# Patient Record
Sex: Female | Born: 1945 | Race: White | Hispanic: No | Marital: Single | Smoking: Current every day smoker
Health system: Southern US, Community
[De-identification: ages and names within clinical notes are randomized; demographics above are authoritative.]

## PROBLEM LIST (undated history)

## (undated) DIAGNOSIS — I2510 Atherosclerotic heart disease of native coronary artery without angina pectoris: Secondary | ICD-10-CM

## (undated) DIAGNOSIS — M069 Rheumatoid arthritis, unspecified: Secondary | ICD-10-CM

## (undated) DIAGNOSIS — M1990 Unspecified osteoarthritis, unspecified site: Secondary | ICD-10-CM

## (undated) HISTORY — PX: TUBAL LIGATION: SHX77

## (undated) HISTORY — PX: TONSILLECTOMY: SUR1361

## (undated) HISTORY — PX: CERVICAL CONIZATION W/BX: SHX1330

---

## 1999-06-22 ENCOUNTER — Other Ambulatory Visit: Admission: RE | Admit: 1999-06-22 | Discharge: 1999-06-22 | Payer: Self-pay | Admitting: Obstetrics and Gynecology

## 2003-09-12 ENCOUNTER — Emergency Department (HOSPITAL_COMMUNITY): Admission: EM | Admit: 2003-09-12 | Discharge: 2003-09-12 | Payer: Self-pay | Admitting: Family Medicine

## 2004-01-12 ENCOUNTER — Ambulatory Visit (HOSPITAL_COMMUNITY): Admission: RE | Admit: 2004-01-12 | Discharge: 2004-01-12 | Payer: Self-pay | Admitting: Internal Medicine

## 2004-04-27 ENCOUNTER — Ambulatory Visit: Payer: Self-pay | Admitting: *Deleted

## 2004-05-11 ENCOUNTER — Ambulatory Visit: Payer: Self-pay | Admitting: Internal Medicine

## 2004-05-22 ENCOUNTER — Ambulatory Visit: Payer: Self-pay | Admitting: Internal Medicine

## 2004-05-29 ENCOUNTER — Ambulatory Visit: Payer: Self-pay | Admitting: Internal Medicine

## 2004-08-18 ENCOUNTER — Encounter: Admission: RE | Admit: 2004-08-18 | Discharge: 2004-08-18 | Payer: Self-pay | Admitting: Rheumatology

## 2008-04-23 ENCOUNTER — Encounter: Payer: Self-pay | Admitting: Internal Medicine

## 2012-05-13 ENCOUNTER — Other Ambulatory Visit (HOSPITAL_COMMUNITY)
Admission: RE | Admit: 2012-05-13 | Discharge: 2012-05-13 | Disposition: A | Discharge status: Home / Self Care | Payer: Medicare Other | Source: Ambulatory Visit | Attending: Family Medicine | Admitting: Family Medicine

## 2012-05-13 ENCOUNTER — Other Ambulatory Visit: Payer: Self-pay | Admitting: Family Medicine

## 2012-05-13 DIAGNOSIS — Z1151 Encounter for screening for human papillomavirus (HPV): Secondary | ICD-10-CM | POA: Insufficient documentation

## 2012-05-13 DIAGNOSIS — Z124 Encounter for screening for malignant neoplasm of cervix: Secondary | ICD-10-CM | POA: Insufficient documentation

## 2012-05-13 DIAGNOSIS — R87810 Cervical high risk human papillomavirus (HPV) DNA test positive: Secondary | ICD-10-CM | POA: Insufficient documentation

## 2012-06-10 ENCOUNTER — Other Ambulatory Visit: Payer: Self-pay | Admitting: Family Medicine

## 2012-09-29 ENCOUNTER — Encounter (HOSPITAL_COMMUNITY): Payer: Self-pay

## 2012-10-09 ENCOUNTER — Encounter (HOSPITAL_COMMUNITY): Payer: Self-pay

## 2012-10-09 ENCOUNTER — Other Ambulatory Visit: Payer: Self-pay | Admitting: Obstetrics and Gynecology

## 2012-10-09 ENCOUNTER — Encounter (HOSPITAL_COMMUNITY)
Admission: RE | Admit: 2012-10-09 | Discharge: 2012-10-09 | Disposition: A | Discharge status: Home / Self Care | Payer: Medicare Other | Source: Ambulatory Visit | Attending: Obstetrics and Gynecology | Admitting: Obstetrics and Gynecology

## 2012-10-09 HISTORY — DX: Unspecified osteoarthritis, unspecified site: M19.90

## 2012-10-09 LAB — CBC
MCH: 31.9 pg (ref 26.0–34.0)
MCHC: 33.3 g/dL (ref 30.0–36.0)
Platelets: 181 10*3/uL (ref 150–400)

## 2012-10-09 NOTE — Patient Instructions (Addendum)
21 [name]  10/09/2012   Your procedure is scheduled on:  10/14/12  Enter through the Main Entrance of [facility] at 930 AM.  Pick up the phone at the desk and dial [phone].   Call this number if you have problems the morning of surgery: [phone]   Remember:   Do not eat food:After Midnight.  Do not drink clear liquids: After Midnight.  Take these medicines the morning of surgery with A SIP OF WATER: NA   Do not wear jewelry, make-up or nail polish.  Do not wear lotions, powders, or perfumes. You may wear deodorant.  Do not shave 48 hours prior to surgery.  Do not bring valuables to the hospital.  Contacts, dentures or bridgework may not be worn into surgery.  Leave suitcase in the car. After surgery it may be brought to your room.  For patients admitted to the hospital, checkout time is 11:00 AM the day of discharge.   Patients discharged the day of surgery will not be allowed to drive home.  Name and phone number of your driver: undecided  Special Instructions: Shower using CHG 2 nights before surgery and the night before surgery.  If you shower the day of surgery use CHG.  Use special wash - you have one bottle of CHG for all showers.  You should use approximately 1/3 of the bottle for each shower.   Please read over the following fact sheets that you were given: Surgical Site Infection Prevention

## 2012-10-09 NOTE — Pre-Procedure Instructions (Signed)
EKG of today (10/09/12) reviewed by Dr Cristela Blue, MD, no orders given.

## 2012-10-14 ENCOUNTER — Ambulatory Visit (HOSPITAL_COMMUNITY): Payer: Medicare Other | Admitting: Anesthesiology

## 2012-10-14 ENCOUNTER — Encounter (HOSPITAL_COMMUNITY): Payer: Self-pay | Admitting: Anesthesiology

## 2012-10-14 ENCOUNTER — Encounter (HOSPITAL_COMMUNITY)
Admission: RE | Disposition: A | Discharge status: Home / Self Care | Payer: Self-pay | Source: Ambulatory Visit | Attending: Obstetrics and Gynecology

## 2012-10-14 ENCOUNTER — Encounter (HOSPITAL_COMMUNITY): Payer: Self-pay | Admitting: *Deleted

## 2012-10-14 ENCOUNTER — Ambulatory Visit (HOSPITAL_COMMUNITY)
Admission: RE | Admit: 2012-10-14 | Discharge: 2012-10-14 | Disposition: A | Discharge status: Home / Self Care | Payer: Medicare Other | Source: Ambulatory Visit | Attending: Obstetrics and Gynecology | Admitting: Obstetrics and Gynecology

## 2012-10-14 DIAGNOSIS — N871 Moderate cervical dysplasia: Secondary | ICD-10-CM | POA: Insufficient documentation

## 2012-10-14 HISTORY — PX: CERVICAL CONIZATION W/BX: SHX1330

## 2012-10-14 SURGERY — CONE BIOPSY, CERVIX
Anesthesia: General | Site: Vagina | Wound class: Clean Contaminated

## 2012-10-14 MED ORDER — PHENYLEPHRINE 40 MCG/ML (10ML) SYRINGE FOR IV PUSH (FOR BLOOD PRESSURE SUPPORT)
PREFILLED_SYRINGE | INTRAVENOUS | Status: AC
  Filled 2012-10-14: qty 5

## 2012-10-14 MED ORDER — DEXAMETHASONE SODIUM PHOSPHATE 10 MG/ML IJ SOLN
INTRAMUSCULAR | Status: AC
  Filled 2012-10-14: qty 1

## 2012-10-14 MED ORDER — MIDAZOLAM HCL 2 MG/2ML IJ SOLN
INTRAMUSCULAR | Status: AC
  Filled 2012-10-14: qty 2

## 2012-10-14 MED ORDER — KETOROLAC TROMETHAMINE 30 MG/ML IJ SOLN
INTRAMUSCULAR | Status: DC | PRN
  Administered 2012-10-14: 15 mg via INTRAVENOUS

## 2012-10-14 MED ORDER — KETOROLAC TROMETHAMINE 30 MG/ML IJ SOLN
INTRAMUSCULAR | Status: AC
  Filled 2012-10-14: qty 1

## 2012-10-14 MED ORDER — KETOROLAC TROMETHAMINE 30 MG/ML IJ SOLN: 15.0000 mg | Freq: Once | INTRAMUSCULAR | Status: DC | PRN

## 2012-10-14 MED ORDER — MIDAZOLAM HCL 2 MG/2ML IJ SOLN: 0.5000 mg | Freq: Once | INTRAMUSCULAR | Status: DC | PRN

## 2012-10-14 MED ORDER — IODINE STRONG (LUGOLS) 5 % PO SOLN
ORAL | Status: DC | PRN
  Administered 2012-10-14: 0.1 mL

## 2012-10-14 MED ORDER — LIDOCAINE HCL (CARDIAC) 20 MG/ML IV SOLN
INTRAVENOUS | Status: DC | PRN
  Administered 2012-10-14: 50 mg via INTRAVENOUS

## 2012-10-14 MED ORDER — DEXAMETHASONE SODIUM PHOSPHATE 4 MG/ML IJ SOLN
INTRAMUSCULAR | Status: DC | PRN
  Administered 2012-10-14: 8 mg via INTRAVENOUS

## 2012-10-14 MED ORDER — LIDOCAINE-EPINEPHRINE 1 %-1:100000 IJ SOLN
INTRAMUSCULAR | Status: DC | PRN
  Administered 2012-10-14: 14 mL

## 2012-10-14 MED ORDER — FERRIC SUBSULFATE SOLN
Status: DC | PRN
  Administered 2012-10-14: 1 via TOPICAL

## 2012-10-14 MED ORDER — MIDAZOLAM HCL 5 MG/5ML IJ SOLN
INTRAMUSCULAR | Status: DC | PRN
  Administered 2012-10-14: 2 mg via INTRAVENOUS

## 2012-10-14 MED ORDER — MEPERIDINE HCL 25 MG/ML IJ SOLN: 6.2500 mg | INTRAMUSCULAR | Status: DC | PRN

## 2012-10-14 MED ORDER — FENTANYL CITRATE 0.05 MG/ML IJ SOLN
INTRAMUSCULAR | Status: AC
  Filled 2012-10-14: qty 5

## 2012-10-14 MED ORDER — PHENYLEPHRINE HCL 10 MG/ML IJ SOLN
INTRAMUSCULAR | Status: DC | PRN
  Administered 2012-10-14: 80 ug via INTRAVENOUS
  Administered 2012-10-14 (×4): 40 ug via INTRAVENOUS

## 2012-10-14 MED ORDER — PROPOFOL 10 MG/ML IV EMUL
INTRAVENOUS | Status: AC
  Filled 2012-10-14: qty 20

## 2012-10-14 MED ORDER — PROMETHAZINE HCL 25 MG/ML IJ SOLN: 6.2500 mg | INTRAMUSCULAR | Status: DC | PRN

## 2012-10-14 MED ORDER — HYDROCODONE-ACETAMINOPHEN 5-500 MG PO TABS: 1.0000 | ORAL_TABLET | ORAL | Status: AC | PRN

## 2012-10-14 MED ORDER — ONDANSETRON HCL 4 MG/2ML IJ SOLN
INTRAMUSCULAR | Status: AC
  Filled 2012-10-14: qty 2

## 2012-10-14 MED ORDER — ONDANSETRON HCL 4 MG/2ML IJ SOLN
INTRAMUSCULAR | Status: DC | PRN
  Administered 2012-10-14: 4 mg via INTRAVENOUS

## 2012-10-14 MED ORDER — LACTATED RINGERS IV SOLN
INTRAVENOUS | Status: DC
  Administered 2012-10-14 (×2): via INTRAVENOUS

## 2012-10-14 MED ORDER — PROPOFOL 10 MG/ML IV EMUL
INTRAVENOUS | Status: DC | PRN
  Administered 2012-10-14: 120 mg via INTRAVENOUS

## 2012-10-14 MED ORDER — IBUPROFEN 600 MG PO TABS: 600.0000 mg | ORAL_TABLET | Freq: Four times a day (QID) | ORAL | Status: AC | PRN

## 2012-10-14 MED ORDER — EPHEDRINE SULFATE 50 MG/ML IJ SOLN
INTRAMUSCULAR | Status: DC | PRN
  Administered 2012-10-14: 10 mg via INTRAVENOUS

## 2012-10-14 MED ORDER — FENTANYL CITRATE 0.05 MG/ML IJ SOLN
INTRAMUSCULAR | Status: DC | PRN
  Administered 2012-10-14: 25 ug via INTRAVENOUS
  Administered 2012-10-14: 50 ug via INTRAVENOUS

## 2012-10-14 MED ORDER — FENTANYL CITRATE 0.05 MG/ML IJ SOLN: 25.0000 ug | INTRAMUSCULAR | Status: DC | PRN

## 2012-10-14 MED ORDER — LIDOCAINE HCL (CARDIAC) 20 MG/ML IV SOLN
INTRAVENOUS | Status: AC
  Filled 2012-10-14: qty 5

## 2012-10-14 MED ORDER — GLYCOPYRROLATE 0.2 MG/ML IJ SOLN
INTRAMUSCULAR | Status: AC
  Filled 2012-10-14: qty 1

## 2012-10-14 SURGICAL SUPPLY — 34 items
BLADE SURG 11 STRL SS (BLADE) ×2 IMPLANT
CATH ROBINSON RED A/P 16FR (CATHETERS) ×2 IMPLANT
CLOTH BEACON ORANGE TIMEOUT ST (SAFETY) ×2 IMPLANT
CONTAINER PREFILL 10% NBF 60ML (FORM) ×2 IMPLANT
COUNTER NEEDLE 1200 MAGNETIC (NEEDLE) ×2 IMPLANT
DRAPE UNDERBUTTOCKS STRL (DRAPE) ×2 IMPLANT
DRESSING TELFA 8X3 (GAUZE/BANDAGES/DRESSINGS) ×2 IMPLANT
ELECT BALL LEEP 5MM RED (ELECTRODE) ×2 IMPLANT
ELECT REM PT RETURN 9FT ADLT (ELECTROSURGICAL) ×2
ELECTRODE REM PT RTRN 9FT ADLT (ELECTROSURGICAL) ×1 IMPLANT
GLOVE BIOGEL M 6.5 STRL (GLOVE) ×6 IMPLANT
GLOVE BIOGEL PI IND STRL 6.5 (GLOVE) ×1 IMPLANT
GLOVE BIOGEL PI INDICATOR 6.5 (GLOVE) ×1
GOWN PREVENTION PLUS XLARGE (GOWN DISPOSABLE) ×2 IMPLANT
GOWN STRL REIN XL XLG (GOWN DISPOSABLE) ×4 IMPLANT
HEMOSTAT SURGICEL 2X3 (HEMOSTASIS) ×1 IMPLANT
HEMOSTAT SURGICEL 4X8 (HEMOSTASIS) ×1 IMPLANT
NDL SPNL 22GX3.5 QUINCKE BK (NEEDLE) ×1 IMPLANT
NEEDLE SPNL 22GX3.5 QUINCKE BK (NEEDLE) ×2 IMPLANT
NS IRRIG 1000ML POUR BTL (IV SOLUTION) ×2 IMPLANT
PACK VAGINAL MINOR WOMEN LF (CUSTOM PROCEDURE TRAY) ×2 IMPLANT
PAD OB MATERNITY 4.3X12.25 (PERSONAL CARE ITEMS) ×2 IMPLANT
PENCIL BUTTON HOLSTER BLD 10FT (ELECTRODE) ×2 IMPLANT
SCOPETTES 8  STERILE (MISCELLANEOUS) ×3
SCOPETTES 8 STERILE (MISCELLANEOUS) ×2 IMPLANT
SPONGE SURGIFOAM ABS GEL 12-7 (HEMOSTASIS) ×1 IMPLANT
SUT VIC AB 0 CT1 27 (SUTURE) ×2
SUT VIC AB 0 CT1 27XBRD ANBCTR (SUTURE) ×3 IMPLANT
SUT VIC AB 0 CT2 27 (SUTURE) ×2 IMPLANT
SYR CONTROL 10ML LL (SYRINGE) ×3 IMPLANT
TOWEL OR 17X24 6PK STRL BLUE (TOWEL DISPOSABLE) ×4 IMPLANT
TUBING CONNECTING 10 (TUBING) ×2 IMPLANT
WATER STERILE IRR 1000ML POUR (IV SOLUTION) ×1 IMPLANT
YANKAUER SUCT BULB TIP NO VENT (SUCTIONS) ×2 IMPLANT

## 2012-10-14 NOTE — Anesthesia Preprocedure Evaluation (Addendum)
Anesthesia Evaluation  Patient identified by MRN, date of birth, ID band Patient awake    Reviewed: Allergy & Precautions, H&P , Patient's Chart, lab work & pertinent test results, reviewed documented beta blocker date and time   History of Anesthesia Complications Negative for: history of anesthetic complications  Airway Mallampati: II TM Distance: >3 FB Neck ROM: full    Dental no notable dental hx.    Pulmonary neg pulmonary ROS,  breath sounds clear to auscultation  Pulmonary exam normal       Cardiovascular Exercise Tolerance: Good negative cardio ROS  Rhythm:regular Rate:Normal     Neuro/Psych negative neurological ROS  negative psych ROS   GI/Hepatic negative GI ROS, Neg liver ROS,   Endo/Other  negative endocrine ROS  Renal/GU negative Renal ROS     Musculoskeletal   Abdominal   Peds  Hematology negative hematology ROS (+)   Anesthesia Other Findings Arthritis   rheumatoid   Reproductive/Obstetrics negative OB ROS                          Anesthesia Physical Anesthesia Plan  ASA: II  Anesthesia Plan: General LMA   Post-op Pain Management:    Induction:   Airway Management Planned:   Additional Equipment:   Intra-op Plan:   Post-operative Plan:   Informed Consent: I have reviewed the patients History and Physical, chart, labs and discussed the procedure including the risks, benefits and alternatives for the proposed anesthesia with the patient or authorized representative who has indicated his/her understanding and acceptance.   Dental Advisory Given  Plan Discussed with: CRNA, Surgeon and Anesthesiologist  Anesthesia Plan Comments:         Anesthesia Quick Evaluation

## 2012-10-14 NOTE — Op Note (Signed)
10/14/2012  12:25 PM  PATIENT:  [name]  67 y.o. 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 Richardson Dopp, MD - Primary  PHYSICIAN ASSISTANT: None   ASSISTANTS: none   ANESTHESIA:   general  EBL:20 cc     Total I/O In: 2000 [I.V.:2000] Out: 100 [Urine:100]  BLOOD ADMINISTERED:none  DRAINS: none   LOCAL MEDICATIONS USED:  LIDOCAINE   SPECIMEN:  Source of Specimen:  cervical cone   DISPOSITION OF SPECIMEN:  PATHOLOGY  COUNTS:  YES  TOURNIQUET:  * No tourniquets in log *  DICTATION: .Dragon Dictation  PLAN OF CARE: Discharge to home after PACU  PATIENT DISPOSITION:  PACU - hemodynamically stable.   Delay start of Pharmacological VTE agent (>24 hrs) due to surgical blood loss or risk of bleeding: not applicable  Indication. 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   Procedure: Pt was taken to the operating room where she was placed under general anesthesia. She was placed in the dorsal lithotomy position, prepped and draped in the usual sterile fashion. A speculum was placed into the vaginal vault.  Suture was placed at  the 3 and 9 o'clock position and used for retraction. Lugol's was used to stain abnormal cells. The scalpel was used to excise a cone shaped portion of the cervix. Hemostasis was obtained with rollerball and Monsel.  surgicel was placed into the site of conization. All instruments were removed from the vagina. The patient was awakened from anesthesia. She was taken to the recovery room in stable condition. All instrument counts were correct x 2.

## 2012-10-14 NOTE — Anesthesia Postprocedure Evaluation (Signed)
  Anesthesia Post-op Note  Anesthesia Post Note  Patient: [name]  Procedure(s) Performed: Procedure(s) (LRB): CONIZATION CERVIX WITH BIOPSY (N/A)  Anesthesia type: General  Patient location: PACU  Post pain: Pain level controlled  Post assessment: Post-op Vital signs reviewed  Last Vitals:  Filed Vitals:   10/14/12 1300  BP: 102/57  Pulse: 86  Temp: 36.6 C  Resp: 16    Post vital signs: Reviewed  Level of consciousness: sedated  Complications: No apparent anesthesia complications

## 2012-10-14 NOTE — Transfer of Care (Signed)
Immediate Anesthesia Transfer of Care Note  Patient: [name]  Procedure(s) Performed: Procedure(s): CONIZATION CERVIX WITH BIOPSY (N/A)  Patient Location: PACU  Anesthesia Type:General  Level of Consciousness: awake, alert  and oriented  Airway & Oxygen Therapy: Patient Spontanous Breathing and Patient connected to nasal cannula oxygen  Post-op Assessment: Report given to PACU RN and Post -op Vital signs reviewed and stable  Post vital signs: Reviewed and stable  Complications: No apparent anesthesia complications

## 2012-10-14 NOTE — H&P (Signed)
Date of Initial H&P:10/09/2012  History reviewed, patient examined, no change in status, stable for surgery.

## 2012-10-15 ENCOUNTER — Encounter (HOSPITAL_COMMUNITY): Payer: Self-pay | Admitting: Obstetrics and Gynecology

## 2013-05-08 ENCOUNTER — Other Ambulatory Visit: Payer: Self-pay

## 2013-05-08 DIAGNOSIS — Z1231 Encounter for screening mammogram for malignant neoplasm of breast: Secondary | ICD-10-CM

## 2013-05-11 ENCOUNTER — Ambulatory Visit
Admission: RE | Admit: 2013-05-11 | Discharge: 2013-05-11 | Disposition: A | Discharge status: Home / Self Care | Payer: Medicare Other | Source: Ambulatory Visit

## 2013-05-11 DIAGNOSIS — Z1231 Encounter for screening mammogram for malignant neoplasm of breast: Secondary | ICD-10-CM

## 2013-05-15 ENCOUNTER — Other Ambulatory Visit: Payer: Self-pay | Admitting: Obstetrics and Gynecology

## 2013-05-15 DIAGNOSIS — R928 Other abnormal and inconclusive findings on diagnostic imaging of breast: Secondary | ICD-10-CM

## 2013-06-01 ENCOUNTER — Other Ambulatory Visit: Payer: Self-pay | Admitting: Obstetrics and Gynecology

## 2013-06-01 ENCOUNTER — Other Ambulatory Visit (HOSPITAL_COMMUNITY)
Admission: RE | Admit: 2013-06-01 | Discharge: 2013-06-01 | Disposition: A | Discharge status: Home / Self Care | Payer: Medicare Other | Source: Ambulatory Visit | Attending: Obstetrics and Gynecology | Admitting: Obstetrics and Gynecology

## 2013-06-01 DIAGNOSIS — Z124 Encounter for screening for malignant neoplasm of cervix: Secondary | ICD-10-CM | POA: Insufficient documentation

## 2013-06-02 ENCOUNTER — Ambulatory Visit
Admission: RE | Admit: 2013-06-02 | Discharge: 2013-06-02 | Disposition: A | Discharge status: Home / Self Care | Payer: Medicare Other | Source: Ambulatory Visit | Attending: Obstetrics and Gynecology | Admitting: Obstetrics and Gynecology

## 2013-06-02 DIAGNOSIS — R928 Other abnormal and inconclusive findings on diagnostic imaging of breast: Secondary | ICD-10-CM

## 2013-09-13 ENCOUNTER — Inpatient Hospital Stay (HOSPITAL_COMMUNITY)
Admission: EM | Admit: 2013-09-13 | Discharge: 2013-09-20 | DRG: 955 | Disposition: E | Payer: No Typology Code available for payment source | Attending: General Surgery | Admitting: General Surgery

## 2013-09-13 ENCOUNTER — Encounter (HOSPITAL_COMMUNITY): Payer: Self-pay | Admitting: Anesthesiology

## 2013-09-13 ENCOUNTER — Inpatient Hospital Stay (HOSPITAL_COMMUNITY): Payer: No Typology Code available for payment source | Admitting: Anesthesiology

## 2013-09-13 ENCOUNTER — Inpatient Hospital Stay (HOSPITAL_COMMUNITY): Payer: No Typology Code available for payment source

## 2013-09-13 ENCOUNTER — Encounter: Payer: Self-pay | Admitting: Cardiology

## 2013-09-13 ENCOUNTER — Emergency Department (HOSPITAL_COMMUNITY): Payer: No Typology Code available for payment source

## 2013-09-13 ENCOUNTER — Encounter (HOSPITAL_COMMUNITY): Admission: EM | Disposition: E | Payer: Medicare Other | Source: Home / Self Care

## 2013-09-13 ENCOUNTER — Inpatient Hospital Stay: Admit: 2013-09-13 | Payer: Medicare Other | Admitting: Neurosurgery

## 2013-09-13 ENCOUNTER — Encounter (HOSPITAL_COMMUNITY): Payer: No Typology Code available for payment source | Admitting: Anesthesiology

## 2013-09-13 DIAGNOSIS — I2584 Coronary atherosclerosis due to calcified coronary lesion: Secondary | ICD-10-CM | POA: Diagnosis present

## 2013-09-13 DIAGNOSIS — S065XAA Traumatic subdural hemorrhage with loss of consciousness status unknown, initial encounter: Secondary | ICD-10-CM

## 2013-09-13 DIAGNOSIS — J95821 Acute postprocedural respiratory failure: Secondary | ICD-10-CM

## 2013-09-13 DIAGNOSIS — S270XXA Traumatic pneumothorax, initial encounter: Secondary | ICD-10-CM

## 2013-09-13 DIAGNOSIS — I959 Hypotension, unspecified: Secondary | ICD-10-CM | POA: Diagnosis present

## 2013-09-13 DIAGNOSIS — S32509A Unspecified fracture of unspecified pubis, initial encounter for closed fracture: Secondary | ICD-10-CM | POA: Diagnosis present

## 2013-09-13 DIAGNOSIS — S2249XA Multiple fractures of ribs, unspecified side, initial encounter for closed fracture: Secondary | ICD-10-CM | POA: Diagnosis present

## 2013-09-13 DIAGNOSIS — S42033A Displaced fracture of lateral end of unspecified clavicle, initial encounter for closed fracture: Secondary | ICD-10-CM | POA: Diagnosis present

## 2013-09-13 DIAGNOSIS — S066XAA Traumatic subarachnoid hemorrhage with loss of consciousness status unknown, initial encounter: Secondary | ICD-10-CM

## 2013-09-13 DIAGNOSIS — I319 Disease of pericardium, unspecified: Secondary | ICD-10-CM | POA: Diagnosis present

## 2013-09-13 DIAGNOSIS — Z66 Do not resuscitate: Secondary | ICD-10-CM | POA: Diagnosis present

## 2013-09-13 DIAGNOSIS — S069XAA Unspecified intracranial injury with loss of consciousness status unknown, initial encounter: Secondary | ICD-10-CM | POA: Diagnosis present

## 2013-09-13 DIAGNOSIS — S272XXA Traumatic hemopneumothorax, initial encounter: Secondary | ICD-10-CM | POA: Diagnosis present

## 2013-09-13 DIAGNOSIS — S22009A Unspecified fracture of unspecified thoracic vertebra, initial encounter for closed fracture: Secondary | ICD-10-CM | POA: Diagnosis present

## 2013-09-13 DIAGNOSIS — I6200 Nontraumatic subdural hemorrhage, unspecified: Secondary | ICD-10-CM | POA: Diagnosis present

## 2013-09-13 DIAGNOSIS — Z9911 Dependence on respirator [ventilator] status: Secondary | ICD-10-CM

## 2013-09-13 DIAGNOSIS — M069 Rheumatoid arthritis, unspecified: Secondary | ICD-10-CM | POA: Diagnosis present

## 2013-09-13 DIAGNOSIS — F17200 Nicotine dependence, unspecified, uncomplicated: Secondary | ICD-10-CM | POA: Diagnosis present

## 2013-09-13 DIAGNOSIS — S32409A Unspecified fracture of unspecified acetabulum, initial encounter for closed fracture: Secondary | ICD-10-CM | POA: Diagnosis present

## 2013-09-13 DIAGNOSIS — I609 Nontraumatic subarachnoid hemorrhage, unspecified: Secondary | ICD-10-CM | POA: Diagnosis present

## 2013-09-13 DIAGNOSIS — I2109 ST elevation (STEMI) myocardial infarction involving other coronary artery of anterior wall: Secondary | ICD-10-CM

## 2013-09-13 DIAGNOSIS — S065X9A Traumatic subdural hemorrhage with loss of consciousness of unspecified duration, initial encounter: Secondary | ICD-10-CM

## 2013-09-13 DIAGNOSIS — S2500XA Unspecified injury of thoracic aorta, initial encounter: Secondary | ICD-10-CM

## 2013-09-13 DIAGNOSIS — J942 Hemothorax: Secondary | ICD-10-CM

## 2013-09-13 DIAGNOSIS — S42109A Fracture of unspecified part of scapula, unspecified shoulder, initial encounter for closed fracture: Secondary | ICD-10-CM | POA: Diagnosis present

## 2013-09-13 DIAGNOSIS — I252 Old myocardial infarction: Secondary | ICD-10-CM

## 2013-09-13 DIAGNOSIS — S069X9A Unspecified intracranial injury with loss of consciousness of unspecified duration, initial encounter: Secondary | ICD-10-CM | POA: Diagnosis present

## 2013-09-13 DIAGNOSIS — K769 Liver disease, unspecified: Secondary | ICD-10-CM | POA: Diagnosis present

## 2013-09-13 DIAGNOSIS — S329XXA Fracture of unspecified parts of lumbosacral spine and pelvis, initial encounter for closed fracture: Secondary | ICD-10-CM | POA: Diagnosis present

## 2013-09-13 DIAGNOSIS — J9600 Acute respiratory failure, unspecified whether with hypoxia or hypercapnia: Secondary | ICD-10-CM | POA: Diagnosis present

## 2013-09-13 DIAGNOSIS — T797XXA Traumatic subcutaneous emphysema, initial encounter: Secondary | ICD-10-CM | POA: Diagnosis present

## 2013-09-13 DIAGNOSIS — S27329A Contusion of lung, unspecified, initial encounter: Secondary | ICD-10-CM | POA: Diagnosis present

## 2013-09-13 DIAGNOSIS — S40019A Contusion of unspecified shoulder, initial encounter: Secondary | ICD-10-CM | POA: Diagnosis present

## 2013-09-13 DIAGNOSIS — S2691XA Contusion of heart, unspecified with or without hemopericardium, initial encounter: Secondary | ICD-10-CM | POA: Diagnosis present

## 2013-09-13 DIAGNOSIS — I2510 Atherosclerotic heart disease of native coronary artery without angina pectoris: Secondary | ICD-10-CM | POA: Diagnosis present

## 2013-09-13 DIAGNOSIS — J9589 Other postprocedural complications and disorders of respiratory system, not elsewhere classified: Secondary | ICD-10-CM | POA: Diagnosis present

## 2013-09-13 DIAGNOSIS — S72009A Fracture of unspecified part of neck of unspecified femur, initial encounter for closed fracture: Secondary | ICD-10-CM | POA: Diagnosis present

## 2013-09-13 DIAGNOSIS — S066X9A Traumatic subarachnoid hemorrhage with loss of consciousness of unspecified duration, initial encounter: Secondary | ICD-10-CM

## 2013-09-13 DIAGNOSIS — S2501XA Minor laceration of thoracic aorta, initial encounter: Secondary | ICD-10-CM | POA: Diagnosis present

## 2013-09-13 DIAGNOSIS — S73006A Unspecified dislocation of unspecified hip, initial encounter: Secondary | ICD-10-CM | POA: Diagnosis present

## 2013-09-13 DIAGNOSIS — Z8249 Family history of ischemic heart disease and other diseases of the circulatory system: Secondary | ICD-10-CM

## 2013-09-13 DIAGNOSIS — K7689 Other specified diseases of liver: Secondary | ICD-10-CM | POA: Diagnosis present

## 2013-09-13 HISTORY — PX: CRANIOTOMY: SHX93

## 2013-09-13 HISTORY — PX: HIP CLOSED REDUCTION: SHX983

## 2013-09-13 HISTORY — DX: Rheumatoid arthritis, unspecified: M06.9

## 2013-09-13 HISTORY — DX: Atherosclerotic heart disease of native coronary artery without angina pectoris: I25.10

## 2013-09-13 LAB — I-STAT CHEM 8, ED
BUN: 17 mg/dL (ref 6–23)
Calcium, Ion: 1.09 mmol/L — ABNORMAL LOW (ref 1.13–1.30)
Chloride: 103 mEq/L (ref 96–112)
Creatinine, Ser: 1 mg/dL (ref 0.50–1.10)
Glucose, Bld: 203 mg/dL — ABNORMAL HIGH (ref 70–99)
HCT: 31 % — ABNORMAL LOW (ref 36.0–46.0)
HEMOGLOBIN: 10.5 g/dL — AB (ref 12.0–15.0)
Potassium: 3.8 mEq/L (ref 3.7–5.3)
Sodium: 138 mEq/L (ref 137–147)
TCO2: 22 mmol/L (ref 0–100)

## 2013-09-13 LAB — CBC
HCT: 24.8 % — ABNORMAL LOW (ref 36.0–46.0)
HEMATOCRIT: 30.3 % — AB (ref 36.0–46.0)
Hemoglobin: 10.3 g/dL — ABNORMAL LOW (ref 12.0–15.0)
Hemoglobin: 8.3 g/dL — ABNORMAL LOW (ref 12.0–15.0)
MCH: 33.9 pg (ref 26.0–34.0)
MCH: 30.4 pg (ref 26.0–34.0)
MCHC: 34 g/dL (ref 30.0–36.0)
MCHC: 33.5 g/dL (ref 30.0–36.0)
MCV: 99.7 fL (ref 78.0–100.0)
MCV: 90.8 fL (ref 78.0–100.0)
PLATELETS: 135 10*3/uL — AB (ref 150–400)
PLATELETS: 72 10*3/uL — AB (ref 150–400)
RBC: 3.04 MIL/uL — ABNORMAL LOW (ref 3.87–5.11)
RBC: 2.73 MIL/uL — AB (ref 3.87–5.11)
RDW: 14.8 % (ref 11.5–15.5)
RDW: 17 % — AB (ref 11.5–15.5)
WBC: 12.2 10*3/uL — ABNORMAL HIGH (ref 4.0–10.5)
WBC: 15 10*3/uL — ABNORMAL HIGH (ref 4.0–10.5)

## 2013-09-13 LAB — URINALYSIS, ROUTINE W REFLEX MICROSCOPIC
BILIRUBIN URINE: NEGATIVE
GLUCOSE, UA: NEGATIVE mg/dL
KETONES UR: NEGATIVE mg/dL
Nitrite: NEGATIVE
PH: 6 (ref 5.0–8.0)
Protein, ur: NEGATIVE mg/dL
Specific Gravity, Urine: 1.043 — ABNORMAL HIGH (ref 1.005–1.030)
Urobilinogen, UA: 0.2 mg/dL (ref 0.0–1.0)

## 2013-09-13 LAB — COMPREHENSIVE METABOLIC PANEL
ALK PHOS: 51 U/L (ref 39–117)
ALT: 152 U/L — AB (ref 0–35)
AST: 212 U/L — ABNORMAL HIGH (ref 0–37)
Albumin: 2.3 g/dL — ABNORMAL LOW (ref 3.5–5.2)
BUN: 15 mg/dL (ref 6–23)
CHLORIDE: 104 meq/L (ref 96–112)
CO2: 19 meq/L (ref 19–32)
Calcium: 7 mg/dL — ABNORMAL LOW (ref 8.4–10.5)
Creatinine, Ser: 0.89 mg/dL (ref 0.50–1.10)
GFR calc non Af Amer: 66 mL/min — ABNORMAL LOW (ref >90)
GFR, EST AFRICAN AMERICAN: 77 mL/min — AB (ref >90)
Glucose, Bld: 206 mg/dL — ABNORMAL HIGH (ref 70–99)
POTASSIUM: 3.9 meq/L (ref 3.7–5.3)
SODIUM: 137 meq/L (ref 137–147)
TOTAL PROTEIN: 4.7 g/dL — AB (ref 6.0–8.3)

## 2013-09-13 LAB — BASIC METABOLIC PANEL
BUN: 12 mg/dL (ref 6–23)
CALCIUM: 7.6 mg/dL — AB (ref 8.4–10.5)
CO2: 21 mEq/L (ref 19–32)
Chloride: 113 mEq/L — ABNORMAL HIGH (ref 96–112)
Creatinine, Ser: 0.68 mg/dL (ref 0.50–1.10)
GFR calc Af Amer: >90 mL/min (ref >90)
GFR, EST NON AFRICAN AMERICAN: 89 mL/min — AB (ref >90)
Glucose, Bld: 152 mg/dL — ABNORMAL HIGH (ref 70–99)
Potassium: 4.2 mEq/L (ref 3.7–5.3)
SODIUM: 144 meq/L (ref 137–147)

## 2013-09-13 LAB — TYPE AND SCREEN
ABO/RH(D): O NEG
Antibody Screen: NEGATIVE

## 2013-09-13 LAB — URINE MICROSCOPIC-ADD ON

## 2013-09-13 LAB — TROPONIN I
Troponin I: <0.3 ng/mL (ref <0.30)
Troponin I: <0.3 ng/mL (ref <0.30)

## 2013-09-13 LAB — CDS SEROLOGY

## 2013-09-13 LAB — PROTIME-INR
INR: 1.25 (ref 0.00–1.49)
INR: 1.52 — ABNORMAL HIGH (ref 0.00–1.49)
Prothrombin Time: 15.4 seconds — ABNORMAL HIGH (ref 11.6–15.2)
Prothrombin Time: 17.9 seconds — ABNORMAL HIGH (ref 11.6–15.2)

## 2013-09-13 LAB — LACTIC ACID, PLASMA: LACTIC ACID, VENOUS: 2.8 mmol/L — AB (ref 0.5–2.2)

## 2013-09-13 LAB — I-STAT CG4 LACTIC ACID, ED: LACTIC ACID, VENOUS: 2.68 mmol/L — AB (ref 0.5–2.2)

## 2013-09-13 LAB — TRIGLYCERIDES: TRIGLYCERIDES: 81 mg/dL (ref <150)

## 2013-09-13 LAB — ABO/RH: ABO/RH(D): O NEG

## 2013-09-13 SURGERY — CRANIOTOMY HEMATOMA EVACUATION SUBDURAL
Anesthesia: General | Site: Hip | Laterality: Right

## 2013-09-13 MED ORDER — HEMOSTATIC AGENTS (NO CHARGE) OPTIME
TOPICAL | Status: DC | PRN
  Administered 2013-09-13: 1 via TOPICAL

## 2013-09-13 MED ORDER — GELATIN ABSORBABLE 100 EX MISC
CUTANEOUS | Status: DC | PRN
  Administered 2013-09-13 (×2): via TOPICAL

## 2013-09-13 MED ORDER — PROPOFOL 10 MG/ML IV EMUL
0.0000 ug/kg/min | INTRAVENOUS | Status: DC
  Administered 2013-09-14: 25 ug/kg/min via INTRAVENOUS
  Administered 2013-09-14: 20 ug/kg/min via INTRAVENOUS
  Filled 2013-09-13 (×2): qty 100

## 2013-09-13 MED ORDER — ACETAMINOPHEN 325 MG PO TABS: 650.0000 mg | ORAL_TABLET | ORAL | Status: DC | PRN

## 2013-09-13 MED ORDER — VECURONIUM BROMIDE 10 MG IV SOLR
INTRAVENOUS | Status: DC | PRN
  Administered 2013-09-13: 10 mg via INTRAVENOUS

## 2013-09-13 MED ORDER — CEFAZOLIN SODIUM-DEXTROSE 2-3 GM-% IV SOLR
INTRAVENOUS | Status: AC
  Filled 2013-09-13: qty 100

## 2013-09-13 MED ORDER — FENTANYL CITRATE 0.05 MG/ML IJ SOLN
INTRAMUSCULAR | Status: AC
  Filled 2013-09-13: qty 2

## 2013-09-13 MED ORDER — CHLORHEXIDINE GLUCONATE 0.12 % MT SOLN
15.0000 mL | Freq: Two times a day (BID) | OROMUCOSAL | Status: DC
  Administered 2013-09-14 – 2013-09-15 (×4): 15 mL via OROMUCOSAL
  Filled 2013-09-13 (×4): qty 15

## 2013-09-13 MED ORDER — MANNITOL 25 % IV SOLN
INTRAVENOUS | Status: DC | PRN
  Administered 2013-09-13: 25 g via INTRAVENOUS

## 2013-09-13 MED ORDER — CALCIUM CHLORIDE 10 % IV SOLN
INTRAVENOUS | Status: DC | PRN
  Administered 2013-09-13: 1 g via INTRAVENOUS

## 2013-09-13 MED ORDER — TETANUS-DIPHTHERIA TOXOIDS TD 5-2 LFU IM INJ
0.5000 mL | INJECTION | Freq: Once | INTRAMUSCULAR | Status: AC
  Administered 2013-09-13: 0.5 mL via INTRAMUSCULAR
  Filled 2013-09-13: qty 0.5

## 2013-09-13 MED ORDER — ONDANSETRON HCL 4 MG/2ML IJ SOLN: 4.0000 mg | Freq: Four times a day (QID) | INTRAMUSCULAR | Status: DC | PRN

## 2013-09-13 MED ORDER — THROMBIN 5000 UNITS EX SOLR
OROMUCOSAL | Status: DC | PRN
  Administered 2013-09-13 (×3): via TOPICAL

## 2013-09-13 MED ORDER — MIDAZOLAM HCL 5 MG/5ML IJ SOLN
INTRAMUSCULAR | Status: AC | PRN
  Administered 2013-09-13: 1 mg via INTRAVENOUS

## 2013-09-13 MED ORDER — BIOTENE DRY MOUTH MT LIQD
15.0000 mL | Freq: Four times a day (QID) | OROMUCOSAL | Status: DC
  Administered 2013-09-14 – 2013-09-15 (×6): 15 mL via OROMUCOSAL

## 2013-09-13 MED ORDER — PANTOPRAZOLE SODIUM 40 MG PO TBEC: 40.0000 mg | DELAYED_RELEASE_TABLET | Freq: Every day | ORAL | Status: DC

## 2013-09-13 MED ORDER — FENTANYL CITRATE 0.05 MG/ML IJ SOLN
INTRAMUSCULAR | Status: DC | PRN
  Administered 2013-09-13: 250 ug via INTRAVENOUS

## 2013-09-13 MED ORDER — SODIUM CHLORIDE 0.9 % IV SOLN
INTRAVENOUS | Status: DC
  Administered 2013-09-14: 1000 mL via INTRAVENOUS
  Administered 2013-09-14 – 2013-09-15 (×3): via INTRAVENOUS

## 2013-09-13 MED ORDER — FENTANYL CITRATE 0.05 MG/ML IJ SOLN: 50.0000 ug | INTRAMUSCULAR | Status: DC | PRN

## 2013-09-13 MED ORDER — FENTANYL CITRATE 0.05 MG/ML IJ SOLN
INTRAMUSCULAR | Status: AC | PRN
  Administered 2013-09-13: 25 ug via INTRAVENOUS
  Administered 2013-09-13: 50 ug via INTRAVENOUS

## 2013-09-13 MED ORDER — ATROPINE SULFATE 0.1 MG/ML IJ SOLN: 1.0000 mg | Freq: Once | INTRAMUSCULAR | Status: DC

## 2013-09-13 MED ORDER — MICROFIBRILLAR COLL HEMOSTAT EX PADS
MEDICATED_PAD | CUTANEOUS | Status: DC | PRN
  Administered 2013-09-13: 1 via TOPICAL

## 2013-09-13 MED ORDER — LIDOCAINE-EPINEPHRINE 1 %-1:100000 IJ SOLN
INTRAMUSCULAR | Status: DC | PRN
  Administered 2013-09-13: 20 mL via INTRADERMAL

## 2013-09-13 MED ORDER — VECURONIUM BROMIDE 10 MG IV SOLR
INTRAVENOUS | Status: AC
  Filled 2013-09-13: qty 10

## 2013-09-13 MED ORDER — 0.9 % SODIUM CHLORIDE (POUR BTL) OPTIME
TOPICAL | Status: DC | PRN
  Administered 2013-09-13 (×3): 1000 mL

## 2013-09-13 MED ORDER — ALBUMIN HUMAN 5 % IV SOLN
INTRAVENOUS | Status: DC | PRN
  Administered 2013-09-13: 20:00:00 via INTRAVENOUS

## 2013-09-13 MED ORDER — ONDANSETRON HCL 4 MG PO TABS: 4.0000 mg | ORAL_TABLET | Freq: Four times a day (QID) | ORAL | Status: DC | PRN

## 2013-09-13 MED ORDER — PANTOPRAZOLE SODIUM 40 MG IV SOLR: 40.0000 mg | Freq: Every day | INTRAVENOUS | Status: DC

## 2013-09-13 MED ORDER — MIDAZOLAM HCL 2 MG/2ML IJ SOLN
INTRAMUSCULAR | Status: AC
  Filled 2013-09-13: qty 4

## 2013-09-13 MED ORDER — ETOMIDATE 2 MG/ML IV SOLN
INTRAVENOUS | Status: AC | PRN
  Administered 2013-09-13: 20 mg via INTRAVENOUS

## 2013-09-13 MED ORDER — IOHEXOL 300 MG/ML  SOLN
100.0000 mL | Freq: Once | INTRAMUSCULAR | Status: AC | PRN
  Administered 2013-09-13: 100 mL via INTRAVENOUS

## 2013-09-13 MED ORDER — CEFAZOLIN SODIUM-DEXTROSE 2-3 GM-% IV SOLR
INTRAVENOUS | Status: DC | PRN
  Administered 2013-09-13: 2 g via INTRAVENOUS

## 2013-09-13 MED ORDER — INSULIN ASPART 100 UNIT/ML ~~LOC~~ SOLN
0.0000 [IU] | Freq: Three times a day (TID) | SUBCUTANEOUS | Status: DC
Start: 2013-09-14 — End: 2013-09-14

## 2013-09-13 MED ORDER — FENTANYL CITRATE 0.05 MG/ML IJ SOLN
INTRAMUSCULAR | Status: AC
  Filled 2013-09-13: qty 5

## 2013-09-13 MED ORDER — SODIUM CHLORIDE 0.9 % IV SOLN
1000.0000 mL | Freq: Once | INTRAVENOUS | Status: AC
  Administered 2013-09-13: 1000 mL via INTRAVENOUS

## 2013-09-13 MED ORDER — SODIUM BICARBONATE 8.4 % IV SOLN
INTRAVENOUS | Status: DC | PRN
  Administered 2013-09-13 (×2): 50 meq via INTRAVENOUS

## 2013-09-13 MED ORDER — SODIUM CHLORIDE 0.9 % IV SOLN
1000.0000 mL | INTRAVENOUS | Status: DC
  Administered 2013-09-13: 20:00:00 via INTRAVENOUS
  Administered 2013-09-13: 1000 mL via INTRAVENOUS

## 2013-09-13 MED ORDER — SUCCINYLCHOLINE CHLORIDE 20 MG/ML IJ SOLN
INTRAMUSCULAR | Status: AC | PRN
  Administered 2013-09-13: 120 mg via INTRAVENOUS

## 2013-09-13 SURGICAL SUPPLY — 62 items
BANDAGE GAUZE 4  KLING STR (GAUZE/BANDAGES/DRESSINGS) ×2 IMPLANT
BANDAGE GAUZE ELAST BULKY 4 IN (GAUZE/BANDAGES/DRESSINGS) ×2 IMPLANT
BIT DRILL WIRE PASS 1.3MM (BIT) IMPLANT
BLADE SURG ROTATE 9660 (MISCELLANEOUS) IMPLANT
BNDG GAUZE ELAST 4 BULKY (GAUZE/BANDAGES/DRESSINGS) ×2 IMPLANT
BRUSH SCRUB EZ PLAIN DRY (MISCELLANEOUS) ×4 IMPLANT
BUR ACORN 6.0 PRECISION (BURR) ×3 IMPLANT
BUR ACORN 6.0MM PRECISION (BURR) ×1
BUR ROUTER D-58 CRANI (BURR) ×4 IMPLANT
CANISTER SUCT 3000ML (MISCELLANEOUS) ×6 IMPLANT
CATH VENTRIC 35X38 W/TROCAR LG (CATHETERS) ×2 IMPLANT
CONT SPEC 4OZ CLIKSEAL STRL BL (MISCELLANEOUS) ×4 IMPLANT
CORDS BIPOLAR (ELECTRODE) ×4 IMPLANT
DRAIN BAG CSF ACCUDRAIN (MISCELLANEOUS) ×2 IMPLANT
DRAIN SNY WOU 7FLT (WOUND CARE) IMPLANT
DRAPE SURG IRRIG POUCH 19X23 (DRAPES) IMPLANT
DRAPE WARM FLUID 44X44 (DRAPE) ×4 IMPLANT
DRILL WIRE PASS 1.3MM (BIT)
DURAFORM SPONGE 2X2 SINGLE (Neuro Prosthesis/Implant) ×4 IMPLANT
DURAPREP 6ML APPLICATOR 50/CS (WOUND CARE) ×4 IMPLANT
ELECT CAUTERY BLADE 6.4 (BLADE) ×4 IMPLANT
ELECT REM PT RETURN 9FT ADLT (ELECTROSURGICAL) ×4
ELECTRODE REM PT RTRN 9FT ADLT (ELECTROSURGICAL) ×2 IMPLANT
EVACUATOR 1/8 PVC DRAIN (DRAIN) IMPLANT
EVACUATOR SILICONE 100CC (DRAIN) IMPLANT
GAUZE SPONGE 4X4 16PLY XRAY LF (GAUZE/BANDAGES/DRESSINGS) IMPLANT
GLOVE BIOGEL M 8.0 STRL (GLOVE) ×6 IMPLANT
GLOVE EXAM NITRILE LRG STRL (GLOVE) IMPLANT
GLOVE EXAM NITRILE MD LF STRL (GLOVE) IMPLANT
GLOVE EXAM NITRILE XL STR (GLOVE) IMPLANT
GLOVE EXAM NITRILE XS STR PU (GLOVE) IMPLANT
GOWN BRE IMP SLV AUR LG STRL (GOWN DISPOSABLE) ×4 IMPLANT
GOWN BRE IMP SLV AUR XL STRL (GOWN DISPOSABLE) IMPLANT
GOWN STRL REIN 2XL LVL4 (GOWN DISPOSABLE) IMPLANT
HEMOSTAT SURGICEL 2X14 (HEMOSTASIS) ×4 IMPLANT
HOOK DURA (MISCELLANEOUS) ×4 IMPLANT
KIT BASIN OR (CUSTOM PROCEDURE TRAY) ×4 IMPLANT
KIT ROOM TURNOVER OR (KITS) ×4 IMPLANT
NS IRRIG 1000ML POUR BTL (IV SOLUTION) ×4 IMPLANT
PACK CRANIOTOMY (CUSTOM PROCEDURE TRAY) ×4 IMPLANT
PAD ABD 8X10 STRL (GAUZE/BANDAGES/DRESSINGS) IMPLANT
PAD ARMBOARD 7.5X6 YLW CONV (MISCELLANEOUS) ×6 IMPLANT
PATTIES SURGICAL .5 X.5 (GAUZE/BANDAGES/DRESSINGS) IMPLANT
PATTIES SURGICAL .5 X3 (DISPOSABLE) IMPLANT
PATTIES SURGICAL 1X1 (DISPOSABLE) IMPLANT
PIN MAYFIELD SKULL DISP (PIN) IMPLANT
SPONGE GAUZE 4X4 12PLY (GAUZE/BANDAGES/DRESSINGS) ×2 IMPLANT
SPONGE NEURO XRAY DETECT 1X3 (DISPOSABLE) IMPLANT
SPONGE SURGIFOAM ABS GEL 100 (HEMOSTASIS) ×4 IMPLANT
STAPLER SKIN PROX WIDE 3.9 (STAPLE) ×4 IMPLANT
SUT ETHILON 3 0 FSL (SUTURE) ×4 IMPLANT
SUT NURALON 4 0 TR CR/8 (SUTURE) ×8 IMPLANT
SUT VIC AB 2-0 CP2 18 (SUTURE) ×8 IMPLANT
SYR 20ML ECCENTRIC (SYRINGE) ×4 IMPLANT
TAPE CLOTH SURG 4X10 WHT LF (GAUZE/BANDAGES/DRESSINGS) ×2 IMPLANT
TOWEL OR 17X24 6PK STRL BLUE (TOWEL DISPOSABLE) ×4 IMPLANT
TOWEL OR 17X26 10 PK STRL BLUE (TOWEL DISPOSABLE) ×4 IMPLANT
TRAY FOLEY CATH 14FRSI W/METER (CATHETERS) IMPLANT
TUBE CONNECTING 12'X1/4 (SUCTIONS) ×1
TUBE CONNECTING 12X1/4 (SUCTIONS) ×3 IMPLANT
UNDERPAD 30X30 INCONTINENT (UNDERPADS AND DIAPERS) IMPLANT
WATER STERILE IRR 1000ML POUR (IV SOLUTION) ×4 IMPLANT

## 2013-09-13 NOTE — ED Notes (Signed)
Paged ortho/Dr. Cornelius Moras @[phone] 
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
## 2013-09-13 NOTE — ED Notes (Signed)
Called dr Dwain Sarna due to HR 130 and pt hypotensive. 5th unit of blood was started prior to transport to OR. Dr Dwain Sarna placed 2nd chest tube right chest.

## 2013-09-13 NOTE — ED Notes (Signed)
Pt placed on bair hugger due to temp 93.4 core

## 2013-09-13 NOTE — ED Notes (Signed)
FFP given on rapid infuser. 1st unit #[id number] given at 1638, 2nd unit [id number] given at 1705,3rd unit #[id number] given at 1714, 4th unit #[id number] given at [phone]
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
## 2013-09-13 NOTE — ED Notes (Signed)
Vascular dr at bedside to eval pt

## 2013-09-13 NOTE — ED Notes (Signed)
Dr Jeral Fruit here to eval pt

## 2013-09-13 NOTE — ED Notes (Signed)
5th unit of prbc cell started. Unit #[id number]
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
## 2013-09-13 NOTE — Anesthesia Preprocedure Evaluation (Signed)
Anesthesia Evaluation  Patient identified by MRN, date of birth, ID band Patient unresponsive    Reviewed: Allergy & Precautions, H&P , NPO status , Patient's Chart, lab work & pertinent test results, Unable to perform ROS - Chart review only  Airway Mallampati: II TM Distance: >3 FB Neck ROM: limited    Dental   Pulmonary  + rhonchi   + decreased breath sounds  + intubated    Cardiovascular + Past MI and + Peripheral Vascular Disease Rhythm:regular Rate:Tachycardia     Neuro/Psych CVA  C-spine not cleared negative psych ROS   GI/Hepatic negative GI ROS, Neg liver ROS,   Endo/Other  negative endocrine ROS  Renal/GU negative Renal ROS  negative genitourinary   Musculoskeletal   Abdominal   Peds  Hematology  (+) anemia ,   Anesthesia Other Findings See surgeon's H&P   Reproductive/Obstetrics negative OB ROS                           Anesthesia Physical Anesthesia Plan  ASA: IV and emergent  Anesthesia Plan: General   Post-op Pain Management:    Induction: Intravenous  Airway Management Planned: Oral ETT  Additional Equipment: Arterial line  Intra-op Plan:   Post-operative Plan: Post-operative intubation/ventilation  Informed Consent: I have reviewed the patients History and Physical, chart, labs and discussed the procedure including the risks, benefits and alternatives for the proposed anesthesia with the patient or authorized representative who has indicated his/her understanding and acceptance.   History available from chart only and Only emergency history available  Plan Discussed with: CRNA and Surgeon  Anesthesia Plan Comments:         Anesthesia Quick Evaluation

## 2013-09-13 NOTE — Procedures (Signed)
Central Venous Catheter Insertion Procedure Note [name] [id number] 1946-06-26  Procedure: Insertion of Central Venous Catheter Indications: hemodynamic instability  Procedure Details Consent: Unable to obtain consent because of emergent medical necessity. Time Out: Verified patient identification, verified procedure, site/side was marked, verified correct patient position, special equipment/implants available, medications/allergies/relevent history reviewed, required imaging and test results available.  Performed  Maximum sterile technique was used including antiseptics. Skin prep: Chlorhexidine; local anesthetic administered A antimicrobial bonded/coated triple lumen catheter was placed in the left subclavian vein using the Seldinger technique.  Evaluation Blood flow good Complications: No apparent complications Patient did tolerate procedure well. Chest X-ray ordered to verify placement.  CXR: pending.  [name] 09/18/2013, 6:32 PM

## 2013-09-13 NOTE — Transfer of Care (Signed)
Immediate Anesthesia Transfer of Care Note  Patient: [name]  Procedure(s) Performed: Procedure(s) with comments: CRANIECTOMY HEMATOMA EVACUATION SUBDURAL (Bilateral) CLOSED REDUCTION HIP (Right) - Application of traction pin and bow. Steinmann pinn used.  Patient Location: NICU  Anesthesia Type:General  Level of Consciousness: Patient remains intubated per anesthesia plan  Airway & Oxygen Therapy: Patient remains intubated per anesthesia plan and Patient placed on Ventilator (see vital sign flow sheet for setting)  Post-op Assessment: Report given to PACU RN and Post -op Vital signs reviewed and stable  Post vital signs: Reviewed and stable  Complications: No apparent anesthesia complications

## 2013-09-13 NOTE — Progress Notes (Signed)
Op note [phone]

## 2013-09-13 NOTE — Procedures (Signed)
Chest Tube Insertion Procedure Note  Indications:  Clinically significant continued hptx with one chest tube in place  Pre-operative Diagnosis: Pneumothorax and Hemothorax  Post-operative Diagnosis: Pneumothorax and Hemothorax  Procedure Details  Informed consent was obtained for the procedure, including sedation.  Risks of lung perforation, hemorrhage, arrhythmia, and adverse drug reaction were discussed.   After sterile skin prep, using standard technique, a 32 French tube was placed in the right anterior rib space.  Findings: Air and blood  Estimated Blood Loss:  Minimal         Specimens:  None              Complications:  None; patient tolerated the procedure well.         Disposition: ICU - intubated and critically ill.         Condition: stable  Attending Attestation: I performed the procedure.

## 2013-09-13 NOTE — ED Notes (Signed)
Dr Charlann Boxer here to eval [phone]
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
## 2013-09-13 NOTE — Brief Op Note (Signed)
08/23/2013  11:33 PM  PATIENT:  [name]  68 y.o. female  PRE-OPERATIVE DIAGNOSIS:  Right hip fracture, dislocation  POST-OPERATIVE DIAGNOSIS:  Right hip fracture, dislocation  PROCEDURE:  Procedure(s) with comments:  Attempted CLOSED REDUCTION HIP (Right) - Application of traction pin and bow.  SURGEON:  Surgeon(s) and Role:   Panel 2:    * Shelda Pal, MD - Primary  PHYSICIAN ASSISTANT:   ASSISTANTS: none   ANESTHESIA:   general  EBL:  Total I/O In: 6891 [I.V.:3400; Blood:3241; IV Piggyback:250] Out: 650 [Urine:650]  BLOOD ADMINISTERED:none  DRAINS: none   LOCAL MEDICATIONS USED:  NONE  SPECIMEN:  No Specimen  DISPOSITION OF SPECIMEN:  N/A  COUNTS:  YES  TOURNIQUET:  * No tourniquets in log *  DICTATION: .Other Dictation: Dictation Number [id number]  PLAN OF CARE: Admit to inpatient   PATIENT DISPOSITION:  ICU - intubated and critically ill.   Delay start of Pharmacological VTE agent (>24hrs) due to surgical blood loss or risk of bleeding: yes

## 2013-09-13 NOTE — Progress Notes (Signed)
  Echocardiogram 2D Echocardiogram has been performed.  [name] [name] Sep 21, 2013, 6:18 PM

## 2013-09-13 NOTE — Procedures (Signed)
Chest Tube Insertion Procedure Note  Indications:  Clinically significant Pneumothorax  Pre-operative Diagnosis: Pneumothorax and Hemothorax  Post-operative Diagnosis: Pneumothorax and Hemothorax  Procedure Details  Informed consent was obtained for the procedure, including sedation.  Risks of lung perforation, hemorrhage, arrhythmia, and adverse drug reaction were discussed.   After sterile skin prep, using standard technique, a 32 French tube was placed in the right lateral 5 rib space.  Findings: Blood and air evacuated  Estimated Blood Loss:  >200 cc in chest         Specimens:  None              Complications:  None; patient tolerated the procedure well.         Disposition: ICU - intubated and critically ill.         Condition: stable  Attending Attestation: I performed the procedure.
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
## 2013-09-13 NOTE — ED Notes (Signed)
Cardiology at bedside to eval pt 
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
## 2013-09-13 NOTE — ED Notes (Addendum)
PRBCs given unit #1 [id number] given at 1555, 2nd unit #[id number] given at 1600, 3rd unit #[id number] given at 1653, 4th unit #[id number] given at 1655- all given on rapid infuser
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
## 2013-09-14 ENCOUNTER — Inpatient Hospital Stay (HOSPITAL_COMMUNITY): Payer: No Typology Code available for payment source

## 2013-09-14 ENCOUNTER — Encounter (HOSPITAL_COMMUNITY): Payer: Self-pay | Admitting: Neurosurgery

## 2013-09-14 DIAGNOSIS — J9600 Acute respiratory failure, unspecified whether with hypoxia or hypercapnia: Secondary | ICD-10-CM

## 2013-09-14 LAB — PREPARE FRESH FROZEN PLASMA
UNIT DIVISION: 0
UNIT DIVISION: 0
UNIT DIVISION: 0
Unit division: 0
Unit division: 0
Unit division: 0
Unit division: 0
Unit division: 0
Unit division: 0
Unit division: 0
Unit division: 0
Unit division: 0

## 2013-09-14 LAB — CBC
HCT: 26 % — ABNORMAL LOW (ref 36.0–46.0)
HCT: 21.5 % — ABNORMAL LOW (ref 36.0–46.0)
HEMOGLOBIN: 8.9 g/dL — AB (ref 12.0–15.0)
Hemoglobin: 7.5 g/dL — ABNORMAL LOW (ref 12.0–15.0)
MCH: 29.4 pg (ref 26.0–34.0)
MCH: 29.1 pg (ref 26.0–34.0)
MCHC: 34.2 g/dL (ref 30.0–36.0)
MCHC: 34.9 g/dL (ref 30.0–36.0)
MCV: 85.8 fL (ref 78.0–100.0)
MCV: 83.3 fL (ref 78.0–100.0)
PLATELETS: 62 10*3/uL — AB (ref 150–400)
Platelets: 66 10*3/uL — ABNORMAL LOW (ref 150–400)
RBC: 3.03 MIL/uL — ABNORMAL LOW (ref 3.87–5.11)
RBC: 2.58 MIL/uL — AB (ref 3.87–5.11)
RDW: 14.6 % (ref 11.5–15.5)
RDW: 15.4 % (ref 11.5–15.5)
WBC: 8.3 10*3/uL (ref 4.0–10.5)
WBC: 6.7 10*3/uL (ref 4.0–10.5)

## 2013-09-14 LAB — TROPONIN I: TROPONIN I: 0.83 ng/mL — AB (ref <0.30)

## 2013-09-14 LAB — PROTIME-INR
INR: 1.16 (ref 0.00–1.49)
PROTHROMBIN TIME: 14.6 s (ref 11.6–15.2)

## 2013-09-14 LAB — POCT I-STAT 7, (LYTES, BLD GAS, ICA,H+H)
Acid-base deficit: 6 mmol/L — ABNORMAL HIGH (ref 0.0–2.0)
Acid-base deficit: 6 mmol/L — ABNORMAL HIGH (ref 0.0–2.0)
BICARBONATE: 20.1 meq/L (ref 20.0–24.0)
Bicarbonate: 19.9 mEq/L — ABNORMAL LOW (ref 20.0–24.0)
CALCIUM ION: 0.85 mmol/L — AB (ref 1.13–1.30)
CALCIUM ION: 0.79 mmol/L — AB (ref 1.13–1.30)
HCT: 17 % — ABNORMAL LOW (ref 36.0–46.0)
HEMATOCRIT: 18 % — AB (ref 36.0–46.0)
HEMOGLOBIN: 6.1 g/dL — AB (ref 12.0–15.0)
Hemoglobin: 5.8 g/dL — CL (ref 12.0–15.0)
O2 SAT: 100 %
O2 Saturation: 100 %
PH ART: 7.327 — AB (ref 7.350–7.450)
PO2 ART: 299 mmHg — AB (ref 80.0–100.0)
PO2 ART: 326 mmHg — AB (ref 80.0–100.0)
Patient temperature: 34
Patient temperature: 33.6
Potassium: 3.4 meq/L — ABNORMAL LOW (ref 3.7–5.3)
Potassium: 3.7 meq/L (ref 3.7–5.3)
Sodium: 144 meq/L (ref 137–147)
Sodium: 144 meq/L (ref 137–147)
TCO2: 21 mmol/L (ref 0–100)
TCO2: 21 mmol/L (ref 0–100)
pCO2 arterial: 36.6 mmHg (ref 35.0–45.0)
pCO2 arterial: 36.4 mmHg (ref 35.0–45.0)
pH, Arterial: 7.333 — ABNORMAL LOW (ref 7.350–7.450)

## 2013-09-14 LAB — PREPARE PLATELET PHERESIS: Unit division: 0

## 2013-09-14 LAB — BASIC METABOLIC PANEL
BUN: 11 mg/dL (ref 6–23)
CO2: 22 mEq/L (ref 19–32)
Calcium: 7.8 mg/dL — ABNORMAL LOW (ref 8.4–10.5)
Chloride: 116 mEq/L — ABNORMAL HIGH (ref 96–112)
Creatinine, Ser: 0.74 mg/dL (ref 0.50–1.10)
GFR calc Af Amer: >90 mL/min (ref >90)
GFR, EST NON AFRICAN AMERICAN: 87 mL/min — AB (ref >90)
GLUCOSE: 129 mg/dL — AB (ref 70–99)
POTASSIUM: 4.2 meq/L (ref 3.7–5.3)
SODIUM: 149 meq/L — AB (ref 137–147)

## 2013-09-14 LAB — CK TOTAL AND CKMB (NOT AT ARMC)
CK TOTAL: 1262 U/L — AB (ref 7–177)
CK TOTAL: 694 U/L — AB (ref 7–177)
CK, MB: 20.7 ng/mL (ref 0.3–4.0)
CK, MB: 14 ng/mL — AB (ref 0.3–4.0)
RELATIVE INDEX: 2 (ref 0.0–4.0)
Relative Index: 1.6 (ref 0.0–2.5)

## 2013-09-14 LAB — POCT I-STAT 3, ART BLOOD GAS (G3+)
Acid-base deficit: 5 mmol/L — ABNORMAL HIGH (ref 0.0–2.0)
Acid-base deficit: 5 mmol/L — ABNORMAL HIGH (ref 0.0–2.0)
Acid-base deficit: 8 mmol/L — ABNORMAL HIGH (ref 0.0–2.0)
Bicarbonate: 21.4 meq/L (ref 20.0–24.0)
Bicarbonate: 21.7 meq/L (ref 20.0–24.0)
Bicarbonate: 22.3 meq/L (ref 20.0–24.0)
O2 Saturation: 100 %
O2 Saturation: 95 %
O2 Saturation: 98 %
PH ART: 7.266 — AB (ref 7.350–7.450)
PH ART: 7.309 — AB (ref 7.350–7.450)
Patient temperature: 33.9
Patient temperature: 34.1
TCO2: 23 mmol/L (ref 0–100)
TCO2: 23 mmol/L (ref 0–100)
TCO2: 24 mmol/L (ref 0–100)
pCO2 arterial: 42.2 mmHg (ref 35.0–45.0)
pCO2 arterial: 47.1 mmHg — ABNORMAL HIGH (ref 35.0–45.0)
pCO2 arterial: 55.1 mmHg — ABNORMAL HIGH (ref 35.0–45.0)
pH, Arterial: 7.18 — CL (ref 7.350–7.450)
pO2, Arterial: 117 mmHg — ABNORMAL HIGH (ref 80.0–100.0)
pO2, Arterial: 182 mmHg — ABNORMAL HIGH (ref 80.0–100.0)
pO2, Arterial: 81 mmHg (ref 80.0–100.0)

## 2013-09-14 LAB — GLUCOSE, CAPILLARY
GLUCOSE-CAPILLARY: 107 mg/dL — AB (ref 70–99)
GLUCOSE-CAPILLARY: 171 mg/dL — AB (ref 70–99)
Glucose-Capillary: 116 mg/dL — ABNORMAL HIGH (ref 70–99)
Glucose-Capillary: 120 mg/dL — ABNORMAL HIGH (ref 70–99)
Glucose-Capillary: 109 mg/dL — ABNORMAL HIGH (ref 70–99)
Glucose-Capillary: 99 mg/dL (ref 70–99)

## 2013-09-14 LAB — MRSA PCR SCREENING: MRSA by PCR: NEGATIVE

## 2013-09-14 LAB — PREPARE RBC (CROSSMATCH)

## 2013-09-14 MED ORDER — DESMOPRESSIN ACETATE 4 MCG/ML IJ SOLN
4.0000 ug | Freq: Once | INTRAMUSCULAR | Status: AC
  Administered 2013-09-14: 4 ug via INTRAVENOUS
  Filled 2013-09-14: qty 1

## 2013-09-14 MED ORDER — ALBUMIN HUMAN 5 % IV SOLN
25.0000 g | Freq: Once | INTRAVENOUS | Status: AC
  Administered 2013-09-14: 25 g via INTRAVENOUS
  Filled 2013-09-14: qty 500

## 2013-09-14 MED ORDER — MANNITOL 25 % IV SOLN
12.5000 g | Freq: Once | INTRAVENOUS | Status: AC
  Administered 2013-09-14: 12.5 g via INTRAVENOUS

## 2013-09-14 MED ORDER — INSULIN ASPART 100 UNIT/ML ~~LOC~~ SOLN: 0.0000 [IU] | SUBCUTANEOUS | Status: DC

## 2013-09-14 MED ORDER — PANTOPRAZOLE SODIUM 40 MG IV SOLR
40.0000 mg | INTRAVENOUS | Status: DC
  Administered 2013-09-14 (×2): 40 mg via INTRAVENOUS
  Filled 2013-09-14 (×3): qty 40

## 2013-09-14 MED ORDER — MANNITOL 25 % IV SOLN
INTRAVENOUS | Status: AC
  Administered 2013-09-14: 12.5 g via INTRAVENOUS
  Filled 2013-09-14: qty 50

## 2013-09-14 MED ORDER — SODIUM CHLORIDE 0.9 % IV BOLUS (SEPSIS)
500.0000 mL | Freq: Once | INTRAVENOUS | Status: AC
  Administered 2013-09-14: 500 mL via INTRAVENOUS

## 2013-09-14 MED ORDER — MANNITOL 25 % IV SOLN
INTRAVENOUS | Status: AC
  Filled 2013-09-14: qty 50

## 2013-09-14 MED FILL — Sodium Bicarbonate IV Soln 8.4%: INTRAVENOUS | Qty: 100 | Status: AC

## 2013-09-14 MED FILL — Magnesium Sulfate Inj 50%: INTRAMUSCULAR | Qty: 2 | Status: AC

## 2013-09-14 NOTE — Progress Notes (Signed)
Patient ID: [name], female   DOB: [birth year], 68 y.o.   MRN: [id number] Patient suddenly had dilatation of both pupils left more than right. i brought the family and along with the trauma doctor we gave the possibilities about the next step. One, will be to go back and remove the clot knowing that she is notclotting properly she might have the same problem all over and the second was to continue medical treatment. They opted for the last choice. Will continue aggressive medical treatment to see how she does

## 2013-09-14 NOTE — Consult Note (Signed)
In light of current neuro status will defer any intervention for aortic injury.  Call if questions.  Fabienne Bruns, MD [facility] [place] Office: [phone] Pager: [phone]

## 2013-09-14 NOTE — Progress Notes (Signed)
Patient ID: [name], female   DOB: June 03, 1946, 68 y.o.   MRN: [id number] Pupils 4 mms, non reactives. Moves right leg to pain, some gag reflex present. No    co rneals. DI under control. Continue aggressive . IVC not working.

## 2013-09-14 NOTE — Progress Notes (Signed)
Patient ID: [name], female   DOB: 11/23/1945, 68 y.o.   MRN: [id number] She remains critically ill.  Her brain injury remains difficult per Dr Eino Farber notes.  Repeat head ct has been done. All other injuries are being managed and do not need further treatment right now.  I do not think these are contributing to current state.  Her neuro exam has changed with pupils and icp is clinically elevated.  Dr Jeral Fruit discussed going to or again but we have decided against that.  She remains coagulopathic and cold.  I have given her 4 more units of ffp.  She has a Lawyer on, room warm and got ffp through warmer.  We will continue to attempt to fix these.  Her son wishes to make her dnr.  We will not undertake acls protocols if she codes.

## 2013-09-14 NOTE — Progress Notes (Signed)
FFP given for level 1 infusion. Unit numbers: [id number]
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
## 2013-09-14 NOTE — Progress Notes (Signed)
Subglottic pressure is 
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
## 2013-09-14 NOTE — Anesthesia Postprocedure Evaluation (Signed)
  Anesthesia Post-op Note  Patient: [name]  Procedure(s) Performed: Procedure(s) with comments: CRANIECTOMY HEMATOMA EVACUATION SUBDURAL (Bilateral) CLOSED REDUCTION HIP (Right) - Application of traction pin and bow. Steinmann pinn used.  Patient Location: ICU  Anesthesia Type:General  Level of Consciousness: sedated and Patient remains intubated per anesthesia plan  Airway and Oxygen Therapy: Patient remains intubated per anesthesia plan and Patient placed on Ventilator (see vital sign flow sheet for setting)  Post-op Pain: none  Post-op Assessment: Post-op Vital signs reviewed, Patient's Cardiovascular Status Stable, Respiratory Function Stable, Patent Airway, No signs of Nausea or vomiting and Pain level controlled  Post-op Vital Signs: Reviewed and stable  Complications: No apparent anesthesia complications
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
## 2013-09-14 NOTE — Progress Notes (Signed)
Family made decision to not add pressors for patient BP support; Dr Lindie Spruce made aware. Current IVF maintained and no further orders given. Will continue to monitor patient.  [name]
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
## 2013-09-14 NOTE — Clinical Social Work Note (Signed)
Clinical Social Worker, MD, CM, PA, RN met with patient family in 3300 conference room to go through patient current medical status.  Patient family seems to be very understanding and realistic regarding patient potential long term deficits.  MD has scheduled follow up family meeting for 10am on Wednesday (09/17/2013).  CSW to complete full assessment with patient family at a later time.  CSW remains available for support to patient family.  Barbette Or, [place]

## 2013-09-14 NOTE — Progress Notes (Signed)
Patient ID: [name], female   DOB: 1945/12/09, 68 y.o.   MRN: [id number] In the icu her IVC stopped draining. i manipulate the catheter with bo csf flow. Her pulse came down to 45 -55 with bp around 120-145. poatable ct head showed the ivc in the ventricular system with frontal hemorrhagic findings  And early findings of herniation. The patient was positioned in an upright sitting and control of the bp was started by trauma beacause of her aortic wall tear. i spoke with son and brother and we talked about treatment and prognosis

## 2013-09-14 NOTE — Progress Notes (Signed)
Family meeting held today with attendees including: Dr. Lindie Spruce, Charma Igo PA, Macario Golds CSW, me, assigned RN for [facility], patient's son and daughter-in-law, brother, pastor.  Pt's current medical status was discussed and explained to family with opportunity for questions from family. Family stated understanding to pt's current status as explained.    Next family meeting planned for 10am on Wednesday 09/14/2013.  This will be in the [facility] conference room if available.   Carlyle Lipa, RN BSN MHA CCM  Case Manager, Trauma Service/Unit [facility] [phone]

## 2013-09-14 NOTE — Progress Notes (Signed)
UR completed.  Spoke with pt's son and other family members. Meeting planned for today at 1300 in [facility] conference room.   Carlyle Lipa, RN BSN MHA CCM Trauma/Neuro ICU Case Manager [phone]
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
## 2013-09-15 ENCOUNTER — Inpatient Hospital Stay (HOSPITAL_COMMUNITY): Payer: No Typology Code available for payment source

## 2013-09-15 LAB — TYPE AND SCREEN
ABO/RH(D): O NEG
Antibody Screen: NEGATIVE
UNIT DIVISION: 0
UNIT DIVISION: 0
UNIT DIVISION: 0
UNIT DIVISION: 0
UNIT DIVISION: 0
Unit division: 0
Unit division: 0
Unit division: 0
Unit division: 0
Unit division: 0
Unit division: 0
Unit division: 0
Unit division: 0
Unit division: 0
Unit division: 0
Unit division: 0
Unit division: 0
Unit division: 0

## 2013-09-15 LAB — PREPARE PLATELET PHERESIS: Unit division: 0

## 2013-09-15 LAB — GLUCOSE, CAPILLARY
GLUCOSE-CAPILLARY: 80 mg/dL (ref 70–99)
Glucose-Capillary: 83 mg/dL (ref 70–99)

## 2013-09-15 NOTE — Progress Notes (Signed)
Patient asystolic on monitor, no heart sounds or pulses, pronounced dead 19-Sep-2013 at 14:43; family in room with patient; Dr. Leonard Schwartz. [place] notified.

## 2013-09-15 NOTE — Progress Notes (Signed)
TOD and MD name given to Officer Onalee Hua in person.
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
## 2013-09-15 NOTE — Progress Notes (Signed)
Patient seen this morning and i did speak with her son. Hypotense, not responding to pain , no gag. Poor prognosis

## 2013-09-17 DIAGNOSIS — S32409A Unspecified fracture of unspecified acetabulum, initial encounter for closed fracture: Secondary | ICD-10-CM | POA: Diagnosis present

## 2013-09-17 DIAGNOSIS — S270XXA Traumatic pneumothorax, initial encounter: Secondary | ICD-10-CM | POA: Diagnosis present

## 2013-09-17 DIAGNOSIS — S069XAA Unspecified intracranial injury with loss of consciousness status unknown, initial encounter: Secondary | ICD-10-CM | POA: Diagnosis present

## 2013-09-17 DIAGNOSIS — J9600 Acute respiratory failure, unspecified whether with hypoxia or hypercapnia: Secondary | ICD-10-CM | POA: Diagnosis present

## 2013-09-17 DIAGNOSIS — I6200 Nontraumatic subdural hemorrhage, unspecified: Secondary | ICD-10-CM | POA: Diagnosis present

## 2013-09-17 DIAGNOSIS — S27329A Contusion of lung, unspecified, initial encounter: Secondary | ICD-10-CM | POA: Diagnosis present

## 2013-09-17 DIAGNOSIS — S329XXA Fracture of unspecified parts of lumbosacral spine and pelvis, initial encounter for closed fracture: Secondary | ICD-10-CM | POA: Diagnosis present

## 2013-09-17 DIAGNOSIS — S069X9A Unspecified intracranial injury with loss of consciousness of unspecified duration, initial encounter: Secondary | ICD-10-CM | POA: Diagnosis present

## 2013-09-17 DIAGNOSIS — S2501XA Minor laceration of thoracic aorta, initial encounter: Secondary | ICD-10-CM | POA: Diagnosis present

## 2013-09-17 DIAGNOSIS — K769 Liver disease, unspecified: Secondary | ICD-10-CM | POA: Diagnosis present

## 2013-09-17 DIAGNOSIS — Z9911 Dependence on respirator [ventilator] status: Secondary | ICD-10-CM
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
## 2013-09-17 NOTE — Discharge Summary (Signed)
Shyah Cadmus, MD, MPH, FACS Trauma: [phone] General Surgery: [phone]  

## 2013-09-20 DEATH — deceased

## 2013-09-23 ENCOUNTER — Encounter: Payer: Self-pay | Admitting: Cardiology

## 2013-09-30 NOTE — Consult Note (Signed)
Please see full dictation from my consultation.    Caitlin Galas, MD [facility] [phone] (p)
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
